# Patient Record
Sex: Female | Born: 1950 | Race: White | Hispanic: No | Marital: Married | Smoking: Never smoker
Health system: Southern US, Community
[De-identification: ages and names within clinical notes are randomized; demographics above are authoritative.]

## PROBLEM LIST (undated history)

## (undated) HISTORY — PX: APPENDECTOMY: SHX54

## (undated) HISTORY — PX: TONSILLECTOMY: SUR1361

---

## 2012-07-10 HISTORY — PX: COLONOSCOPY: SHX174

## 2019-04-09 ENCOUNTER — Other Ambulatory Visit: Payer: Self-pay | Admitting: Internal Medicine

## 2019-04-09 DIAGNOSIS — Z1231 Encounter for screening mammogram for malignant neoplasm of breast: Secondary | ICD-10-CM

## 2019-04-10 ENCOUNTER — Other Ambulatory Visit: Payer: Self-pay | Admitting: Internal Medicine

## 2019-04-10 DIAGNOSIS — Z8249 Family history of ischemic heart disease and other diseases of the circulatory system: Secondary | ICD-10-CM

## 2019-04-11 ENCOUNTER — Ambulatory Visit
Admission: RE | Admit: 2019-04-11 | Discharge: 2019-04-11 | Disposition: A | Discharge status: Home / Self Care | Payer: Medicare Other | Source: Ambulatory Visit | Attending: Internal Medicine | Admitting: Internal Medicine

## 2019-04-11 DIAGNOSIS — Z8249 Family history of ischemic heart disease and other diseases of the circulatory system: Secondary | ICD-10-CM

## 2019-04-16 ENCOUNTER — Other Ambulatory Visit: Payer: Self-pay

## 2019-04-16 ENCOUNTER — Ambulatory Visit
Admission: RE | Admit: 2019-04-16 | Discharge: 2019-04-16 | Disposition: A | Discharge status: Home / Self Care | Payer: Medicare Other | Source: Ambulatory Visit | Attending: Internal Medicine | Admitting: Internal Medicine

## 2019-04-16 DIAGNOSIS — Z1231 Encounter for screening mammogram for malignant neoplasm of breast: Secondary | ICD-10-CM

## 2019-05-07 ENCOUNTER — Other Ambulatory Visit: Payer: Self-pay | Admitting: Internal Medicine

## 2019-05-07 DIAGNOSIS — R928 Other abnormal and inconclusive findings on diagnostic imaging of breast: Secondary | ICD-10-CM

## 2019-05-22 ENCOUNTER — Other Ambulatory Visit: Payer: Self-pay

## 2019-05-23 ENCOUNTER — Ambulatory Visit: Payer: Self-pay | Admitting: Obstetrics & Gynecology

## 2019-12-17 ENCOUNTER — Ambulatory Visit (INDEPENDENT_AMBULATORY_CARE_PROVIDER_SITE_OTHER): Payer: Medicare Other | Admitting: Obstetrics and Gynecology

## 2019-12-17 ENCOUNTER — Encounter: Payer: Self-pay | Admitting: Obstetrics and Gynecology

## 2019-12-17 ENCOUNTER — Other Ambulatory Visit: Payer: Self-pay

## 2019-12-17 VITALS — BP 118/78 | Ht 66.5 in | Wt 149.0 lb

## 2019-12-17 DIAGNOSIS — Z01419 Encounter for gynecological examination (general) (routine) without abnormal findings: Secondary | ICD-10-CM | POA: Diagnosis not present

## 2019-12-17 DIAGNOSIS — Z780 Asymptomatic menopausal state: Secondary | ICD-10-CM

## 2019-12-17 NOTE — Progress Notes (Signed)
Tonya Vasquez Feb 18, 1951 [id number]  SUBJECTIVE:  69 y.o. G2P2 female new patient here for annual routine gynecologic exam.  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 She has no gynecologic concerns.   Current Outpatient Medications  Medication Sig Dispense Refill  . Multiple Vitamin (MULTIVITAMIN) tablet Take 1 tablet by mouth daily.    . Multiple Vitamins-Minerals (ZINC PO) Take by mouth.    Marland Kitchen VITAMIN E PO Take by mouth.     No current facility-administered medications for this visit.   Allergies: Patient has no known allergies.  No LMP recorded. Patient is postmenopausal.  Past medical history,surgical history, problem list, medications, allergies, family history and social history were all reviewed and documented as reviewed in the EPIC chart.  ROS:  Feeling well. No dyspnea or chest pain on exertion.  No abdominal pain, change in bowel habits, black or bloody stools.  No urinary tract symptoms. GYN ROS: no abnormal bleeding, pelvic pain or discharge, no breast pain or new or enlarging lumps on self exam. No neurological complaints.  OBJECTIVE:  BP 118/78   Ht 5' 6.5" (1.689 m)   Wt 149 lb (67.6 kg)   BMI 23.69 kg/m  The patient appears well, alert, oriented x 3, in no distress. ENT normal.  Neck supple. No cervical or supraclavicular adenopathy or thyromegaly.  Lungs are clear, good air entry, no wheezes, rhonchi or rales. S1 and S2 normal, no murmurs, regular rate and rhythm.  Abdomen soft without tenderness, guarding, mass or organomegaly.  Neurological is normal, no focal findings.  BREAST EXAM: breasts appear normal, no suspicious masses, no skin or nipple changes or axillary nodes  PELVIC EXAM: VULVA: normal appearing vulva with no masses, tenderness or lesions, atrophic changes, VAGINA: normal appearing vagina  with normal color and discharge, no lesions, CERVIX: normal appearing cervix without discharge or lesions, UTERUS: uterus is normal size, shape, consistency and nontender, ADNEXA: normal adnexa in size, nontender and no masses  Chaperone: Kennon Portela present during the examination  ASSESSMENT:  69 y.o. G2P2 here for annual gynecologic exam  PLAN:   1. Postmenopausal.  No hot flashes, no night sweats.  No vaginal bleeding. 2. Pap smear 2019.  No significant history of abnormal Pap smears.  Reports her Pap smear was normal last 2019 from [place], we do not have records available.  Next Pap smear due 2022 following the current guidelines recommending the 3 year interval, which would be next year if she should desire to screen.  We did discuss that per the current screening guidelines, if no recent abnormal Pap smears, after age 67 screening is not required.  We will readdress on annual basis. 3. Mammogram 04/2019.  Normal breast exam today.  She is reminded to schedule an annual mammogram this year when due. 4. Colonoscopy 2016.  Recommended that she follow up at the recommended interval.   5. DEXA 3 years ago or so per patient, she will check on dates.  She says that test was normal, which if it was, then the routine screening interval is every 5 years.  She is sending another request for records to her previous healthcare facility.   6. Health maintenance.  No labs today as she normally has these completed with her primary care provider.   Return annually or sooner, prn.  Theresia Majors MD 12/17/19

## 2020-05-12 ENCOUNTER — Other Ambulatory Visit: Payer: Self-pay | Admitting: Registered Nurse

## 2020-05-12 DIAGNOSIS — Z8249 Family history of ischemic heart disease and other diseases of the circulatory system: Secondary | ICD-10-CM

## 2020-05-18 ENCOUNTER — Other Ambulatory Visit: Payer: Self-pay | Admitting: Registered Nurse

## 2020-05-18 DIAGNOSIS — E2839 Other primary ovarian failure: Secondary | ICD-10-CM

## 2020-05-18 DIAGNOSIS — Z1231 Encounter for screening mammogram for malignant neoplasm of breast: Secondary | ICD-10-CM

## 2020-05-25 ENCOUNTER — Ambulatory Visit
Admission: RE | Admit: 2020-05-25 | Discharge: 2020-05-25 | Disposition: A | Discharge status: Home / Self Care | Payer: Medicare Other | Source: Ambulatory Visit | Attending: Registered Nurse | Admitting: Registered Nurse

## 2020-05-25 DIAGNOSIS — Z8249 Family history of ischemic heart disease and other diseases of the circulatory system: Secondary | ICD-10-CM

## 2020-06-18 ENCOUNTER — Other Ambulatory Visit: Payer: Self-pay

## 2020-06-18 ENCOUNTER — Ambulatory Visit
Admission: RE | Admit: 2020-06-18 | Discharge: 2020-06-18 | Disposition: A | Discharge status: Home / Self Care | Payer: Medicare Other | Source: Ambulatory Visit | Attending: Registered Nurse | Admitting: Registered Nurse

## 2020-06-18 DIAGNOSIS — Z1231 Encounter for screening mammogram for malignant neoplasm of breast: Secondary | ICD-10-CM

## 2020-06-28 ENCOUNTER — Ambulatory Visit: Payer: Medicare Other

## 2020-09-06 ENCOUNTER — Other Ambulatory Visit: Payer: Medicare Other

## 2020-09-29 IMAGING — MG DIGITAL SCREENING BILAT W/ TOMO W/ CAD
8 series · 8 of 24 positions shown · non-contrast
Comparison: None.
COMPARISON: None.

Addendum:
CLINICAL DATA: Screening.

EXAM:
DIGITAL SCREENING BILATERAL MAMMOGRAM WITH TOMO AND CAD

[L MLO synth-2D]
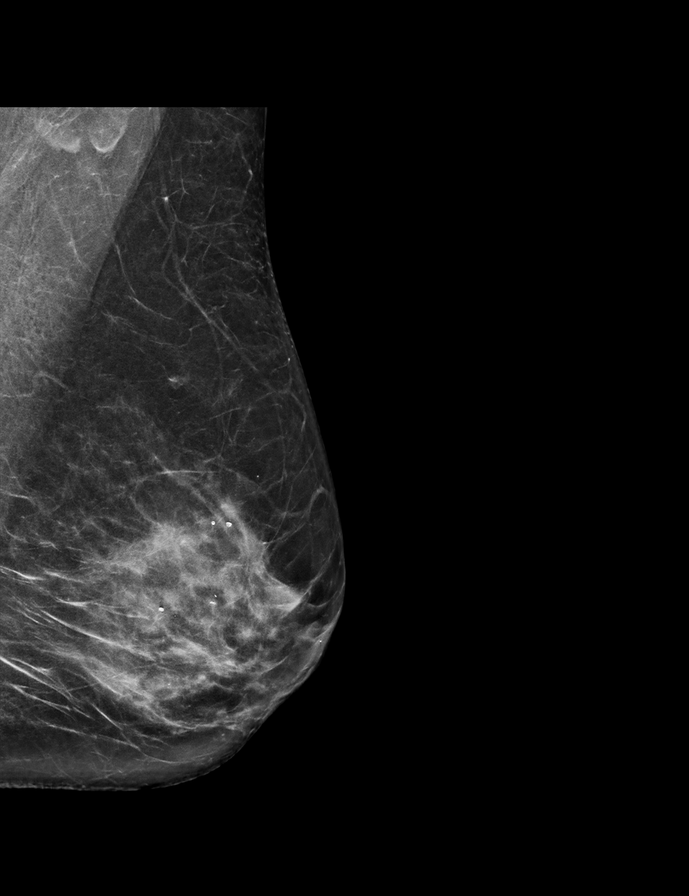

[L CC synth-2D]
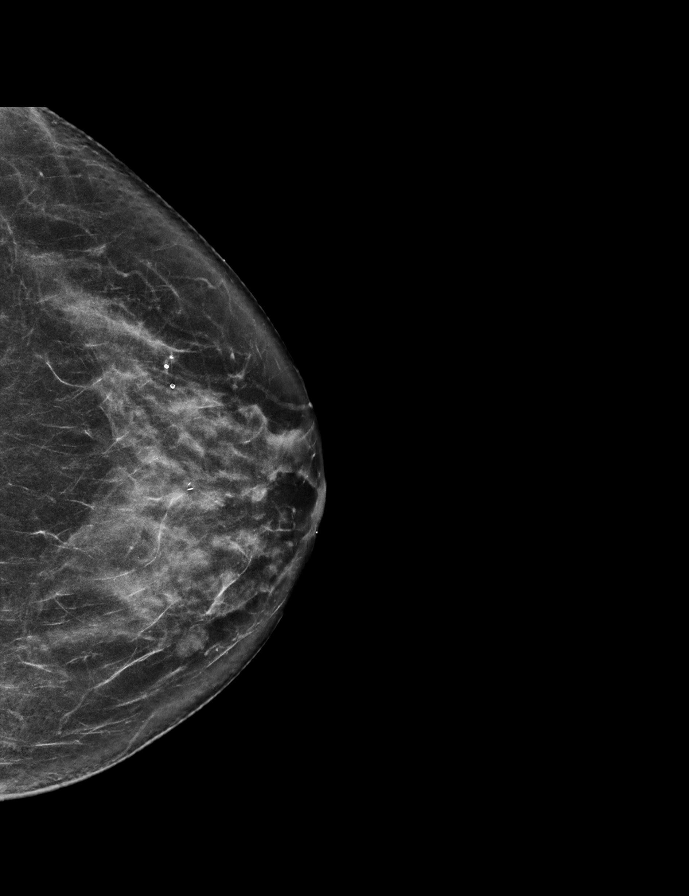

[R CC synth-2D]
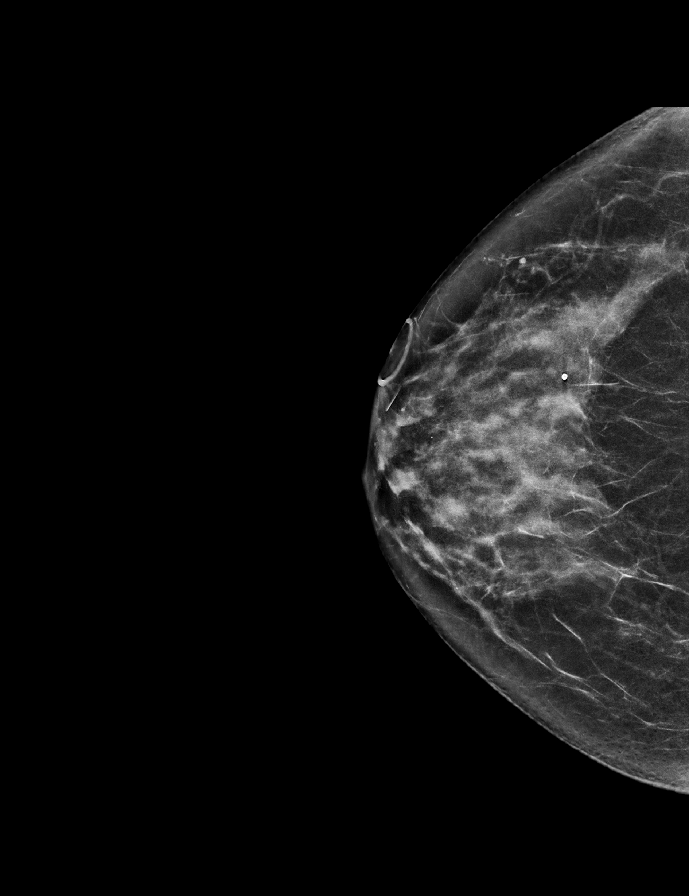

[R MLO synth-2D]
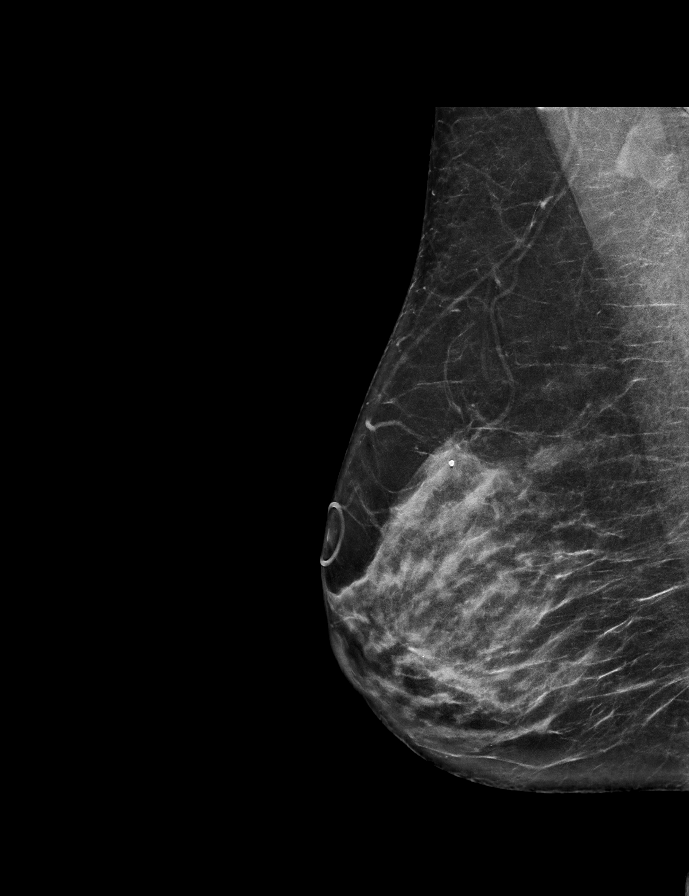

[R CC tomo · tomo slice 31/62.0]
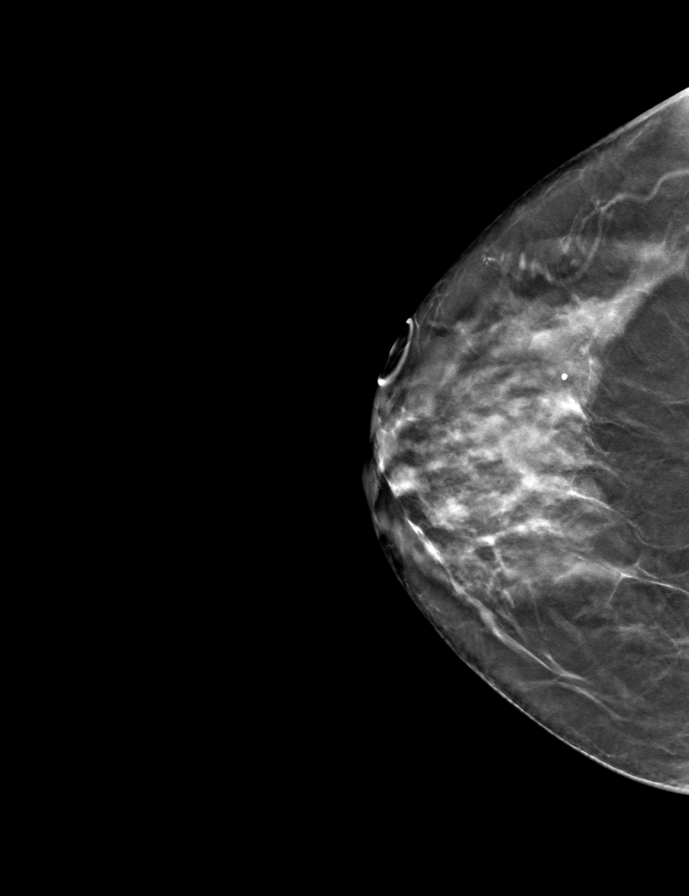

[L MLO tomo · tomo slice 35/68.0]
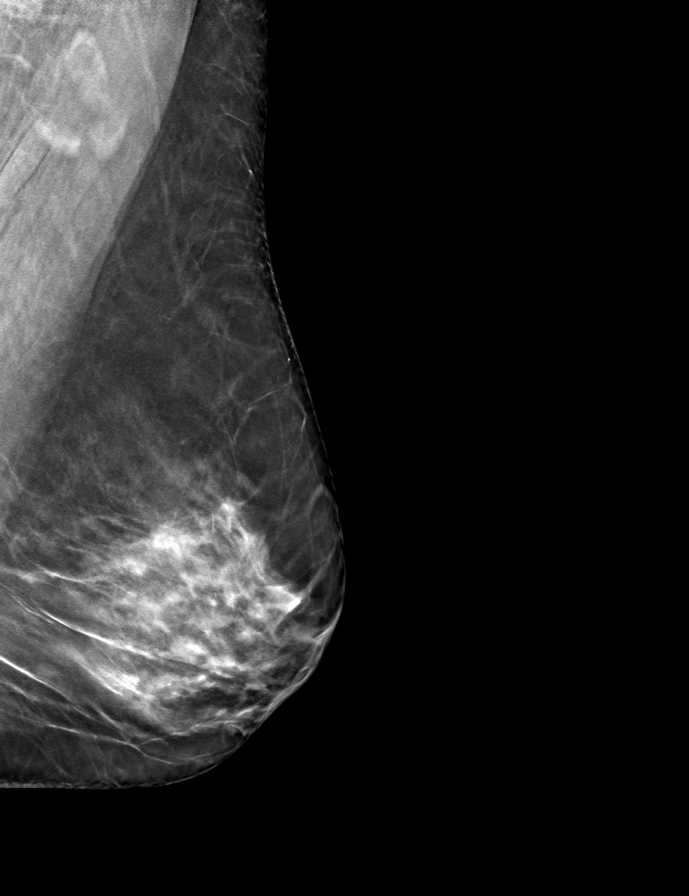

[R MLO tomo · tomo slice 33/65.0]
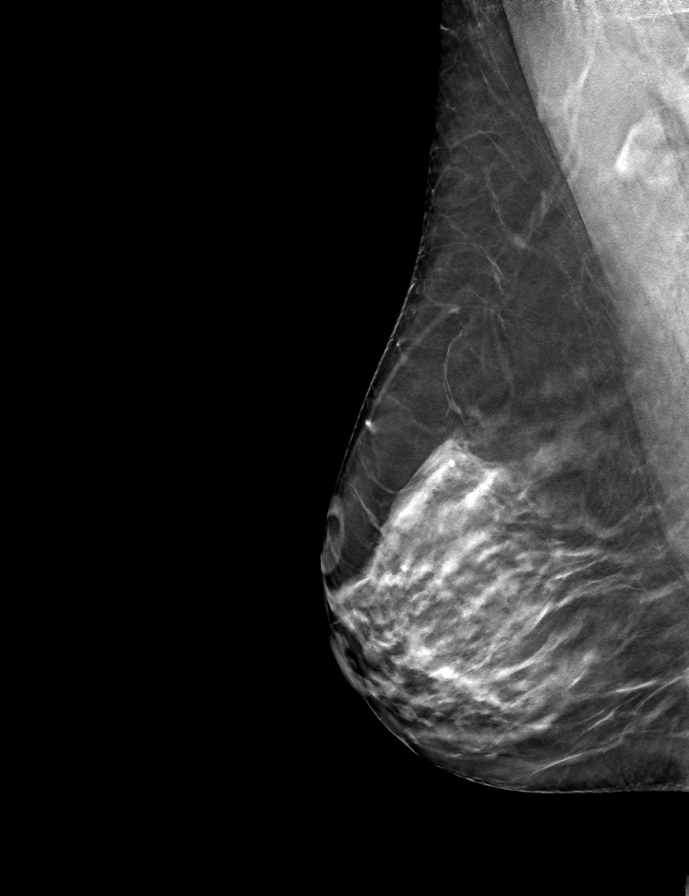

[L CC tomo · tomo slice 33/64.0]
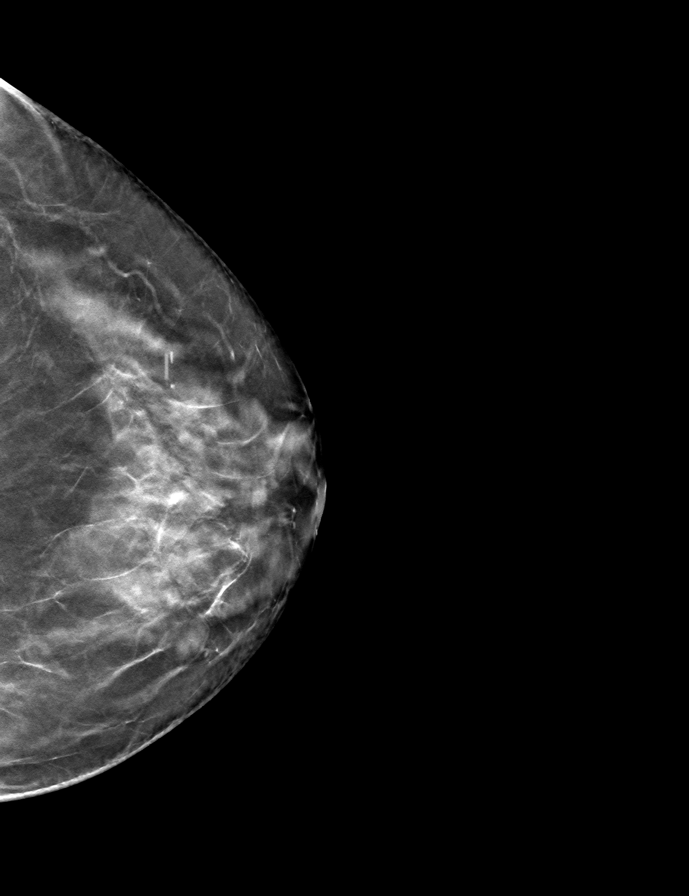

[8 of 24 positions shown; findings below may reference images not displayed]

Multiple attempts to obtain previous examinations
were unsuccessful.

ACR Breast Density Category c: The breast tissue is heterogeneously
dense, which may obscure small masses.
FINDINGS: In the left breast, a possible mass warrants further evaluation. In
the right breast, no findings suspicious for malignancy.

Images were processed with CAD.
IMPRESSION: Further evaluation is suggested for possible mass in the left
breast.

RECOMMENDATION:
Diagnostic mammogram and possibly ultrasound of the left breast.
(Code:F0-A-RRY)

The patient will be contacted regarding the findings, and additional
imaging will be scheduled.

BI-RADS CATEGORY  0: Incomplete. Need additional imaging evaluation
and/or prior mammograms for comparison.

ADDENDUM:
Previous examinations from [REDACTED], [REDACTED], dating back to
01/31/2016, are now available for comparison. The currently
suspected left breast mass has been present on the previous
examinations dating back to 6398 and is unchanged. Therefore, this
is benign and does not require further follow-up.
IMPRESSION: Mammographically stable benign left breast mass.

RECOMMENDATION:

Bilateral screening mammogram in 1 year.

BI-RADS CATEGORY:

2: Benign.

*** End of Addendum ***
Multiple attempts to obtain previous examinations
were unsuccessful.

ACR Breast Density Category c: The breast tissue is heterogeneously
dense, which may obscure small masses.
FINDINGS: In the left breast, a possible mass warrants further evaluation. In
the right breast, no findings suspicious for malignancy.

Images were processed with CAD.
IMPRESSION: Further evaluation is suggested for possible mass in the left
breast.

RECOMMENDATION:
Diagnostic mammogram and possibly ultrasound of the left breast.
(Code:F0-A-RRY)

The patient will be contacted regarding the findings, and additional
imaging will be scheduled.

BI-RADS CATEGORY  0: Incomplete. Need additional imaging evaluation
and/or prior mammograms for comparison.

## 2021-02-23 ENCOUNTER — Other Ambulatory Visit: Payer: Medicare Other

## 2021-02-23 ENCOUNTER — Ambulatory Visit
Admission: RE | Admit: 2021-02-23 | Discharge: 2021-02-23 | Disposition: A | Discharge status: Home / Self Care | Payer: Medicare Other | Source: Ambulatory Visit | Attending: Registered Nurse | Admitting: Registered Nurse

## 2021-02-23 ENCOUNTER — Other Ambulatory Visit: Payer: Self-pay

## 2021-02-23 DIAGNOSIS — E2839 Other primary ovarian failure: Secondary | ICD-10-CM

## 2021-04-06 ENCOUNTER — Ambulatory Visit: Payer: Medicare Other | Admitting: Nurse Practitioner

## 2021-04-25 ENCOUNTER — Other Ambulatory Visit: Payer: Self-pay

## 2021-04-25 ENCOUNTER — Ambulatory Visit (INDEPENDENT_AMBULATORY_CARE_PROVIDER_SITE_OTHER): Payer: Medicare Other | Admitting: Nurse Practitioner

## 2021-04-25 ENCOUNTER — Encounter: Payer: Self-pay | Admitting: Nurse Practitioner

## 2021-04-25 ENCOUNTER — Other Ambulatory Visit (HOSPITAL_COMMUNITY)
Admission: RE | Admit: 2021-04-25 | Discharge: 2021-04-25 | Disposition: A | Discharge status: Home / Self Care | Payer: Medicare Other | Source: Ambulatory Visit | Attending: Nurse Practitioner | Admitting: Nurse Practitioner

## 2021-04-25 VITALS — BP 124/80 | Ht 66.0 in | Wt 152.0 lb

## 2021-04-25 DIAGNOSIS — Z780 Asymptomatic menopausal state: Secondary | ICD-10-CM

## 2021-04-25 DIAGNOSIS — Z01419 Encounter for gynecological examination (general) (routine) without abnormal findings: Secondary | ICD-10-CM | POA: Insufficient documentation

## 2021-04-25 DIAGNOSIS — M8589 Other specified disorders of bone density and structure, multiple sites: Secondary | ICD-10-CM

## 2021-04-25 NOTE — Progress Notes (Signed)
   Tonya Vasquez 1951-05-12 [id number]   History:  70 y.o. G2P2 presents for breast and pelvic exam. No GYN complaints. Postmenopausal - no HRT, no bleeding. Reports normal pap history. Moved from [place] a few years ago. Neurology managing meningioma (found 2013).   Gynecologic History No LMP recorded. Patient is postmenopausal.   Contraception: post menopausal status Sexually active: Yes  Health Maintenance Last Pap: 2019. Results were: Normal Last mammogram: 06/18/2020. Results were: Normal Last colonoscopy: 2016 per patient. Results were: Normal, 10-year recall Last Dexa: 02/23/2021. Results were: T-score -2.2, FRAX 8.4% / 0.8%  Past medical history, past surgical history, family history and social history were all reviewed and documented in the EPIC chart. Married. Daughter lives here, son in [place]. Alternates time between here and [place].   ROS:  A ROS was performed and pertinent positives and negatives are included.  Exam:  Vitals:   04/25/21 1053  BP: 124/80  Weight: 152 lb (68.9 kg)  Height: 5\' 6"  (1.676 m)   Body mass index is 24.53 kg/m.  General appearance:  Normal Thyroid:  Symmetrical, normal in size, without palpable masses or nodularity. Respiratory  Auscultation:  Clear without wheezing or rhonchi Cardiovascular  Auscultation:  Regular rate, without rubs, murmurs or gallops  Edema/varicosities:  Not grossly evident Abdominal  Soft,nontender, without masses, guarding or rebound.  Liver/spleen:  No organomegaly noted  Hernia:  None appreciated  Skin  Inspection:  Grossly normal Breasts: Examined lying and sitting.   Right: Without masses, retractions, nipple discharge or axillary adenopathy.   Left: Without masses, retractions, nipple discharge or axillary adenopathy. Genitourinary   Inguinal/mons:  Normal without inguinal adenopathy  External genitalia:  Normal appearing vulva with no masses, tenderness, or lesions  BUS/Urethra/Skene's glands:   Normal  Vagina:  Atrophic changes  Cervix:  Normal appearing without discharge or lesions  Uterus:  Normal in size, shape and contour.  Midline and mobile, nontender  Adnexa/parametria:     Rt: Normal in size, without masses or tenderness.   Lt: Normal in size, without masses or tenderness.  Anus and perineum: Normal  Digital rectal exam: Normal sphincter tone without palpated masses or tenderness  Patient informed chaperone available to be present for breast and pelvic exam. Patient has requested no chaperone to be present. Patient has been advised what will be completed during breast and pelvic exam.   Assessment/Plan:  70 y.o. G2P2 for breast and pelvic exam.   Well female exam with routine gynecological exam - Plan: Cytology - PAP( ). Education provided on SBEs, importance of preventative screenings, current guidelines, high calcium diet, regular exercise, and multivitamin daily.  Labs with PCP.   Postmenopausal - no HRT, no bleeding.   Osteopenia of multiple sites - Continue Vitamin D + Calcium and regular exercise.   Screening for cervical cancer - Normal Pap history. Discussed current guidelines and option to stop screenings. We agree to do pap today and if normal stop screenings.   Screening for breast cancer - Normal mammogram history.  Continue annual screenings.  Normal breast exam today.  Screening for colon cancer - 2016 colonoscopy. Will repeat at GI's recommended interval.   Return in 2 years for breast and pelvic exam.   2017 DNP, 11:14 AM 04/25/2021

## 2021-04-26 LAB — CYTOLOGY - PAP: Diagnosis: NEGATIVE

## 2021-08-16 ENCOUNTER — Other Ambulatory Visit: Payer: Self-pay | Admitting: Registered Nurse

## 2021-08-16 DIAGNOSIS — Z1231 Encounter for screening mammogram for malignant neoplasm of breast: Secondary | ICD-10-CM

## 2021-08-29 ENCOUNTER — Ambulatory Visit
Admission: RE | Admit: 2021-08-29 | Discharge: 2021-08-29 | Disposition: A | Discharge status: Home / Self Care | Payer: Medicare HMO | Source: Ambulatory Visit | Attending: Registered Nurse | Admitting: Registered Nurse

## 2021-08-29 DIAGNOSIS — Z1231 Encounter for screening mammogram for malignant neoplasm of breast: Secondary | ICD-10-CM

## 2022-05-17 ENCOUNTER — Other Ambulatory Visit: Payer: Self-pay | Admitting: Registered Nurse

## 2022-05-17 DIAGNOSIS — I700 Atherosclerosis of aorta: Secondary | ICD-10-CM

## 2022-05-24 ENCOUNTER — Ambulatory Visit
Admission: RE | Admit: 2022-05-24 | Discharge: 2022-05-24 | Disposition: A | Discharge status: Home / Self Care | Payer: Medicare HMO | Source: Ambulatory Visit | Attending: Registered Nurse | Admitting: Registered Nurse

## 2022-05-24 DIAGNOSIS — I700 Atherosclerosis of aorta: Secondary | ICD-10-CM

## 2022-09-15 ENCOUNTER — Other Ambulatory Visit: Payer: Self-pay | Admitting: Registered Nurse

## 2022-09-15 DIAGNOSIS — Z1231 Encounter for screening mammogram for malignant neoplasm of breast: Secondary | ICD-10-CM

## 2022-09-19 ENCOUNTER — Ambulatory Visit
Admission: RE | Admit: 2022-09-19 | Discharge: 2022-09-19 | Disposition: A | Discharge status: Home / Self Care | Payer: Medicare HMO | Source: Ambulatory Visit | Attending: Registered Nurse | Admitting: Registered Nurse

## 2022-09-19 DIAGNOSIS — Z1231 Encounter for screening mammogram for malignant neoplasm of breast: Secondary | ICD-10-CM

## 2023-05-09 ENCOUNTER — Telehealth: Payer: Self-pay | Admitting: Gastroenterology

## 2023-05-09 ENCOUNTER — Ambulatory Visit (AMBULATORY_SURGERY_CENTER): Payer: Medicare HMO | Admitting: *Deleted

## 2023-05-09 VITALS — Ht 66.0 in | Wt 147.0 lb

## 2023-05-09 DIAGNOSIS — Z1211 Encounter for screening for malignant neoplasm of colon: Secondary | ICD-10-CM

## 2023-05-09 MED ORDER — CLENPIQ 10-3.5-12 MG-GM -GM/160ML PO SOLN: 320.0000 mL | Freq: Once | ORAL | 0 refills | Status: AC

## 2023-05-09 MED ORDER — SUTAB 1479-225-188 MG PO TABS: 24.0000 | ORAL_TABLET | Freq: Once | ORAL | 0 refills | Status: DC

## 2023-05-09 NOTE — Addendum Note (Signed)
Addended by: Caren Griffins F on: 05/09/2023 12:13 PM   Modules accepted: Orders, Level of Service

## 2023-05-09 NOTE — Progress Notes (Addendum)
Pre visit completed over telephone. Instructions forwarded through MyChart and mailed, along with [facility] Sutab coupon. Patient requested tabs, Advised pt that Sutab was NOT our preferred prep but patient maintained that is what she thought she could manage.  PATIENT CALLED BACK ASKING TO SWITCH TO CLENPIQ- CANCELLED SUTAB AND SENT RX OF CLENPIQ, PHARMACY NOTIFIED OF THE SWITCH  Instructions retracted and re-sent new instructions for CLENPIQ, also mailed to home address   No egg or soy allergy known to patient  No issues known to pt with past sedation with any surgeries or procedures Patient denies ever being told they had issues or difficulty with intubation  No FH of Malignant Hyperthermia Pt is not on diet pills Pt is not on  home 02  Pt is not on blood thinners  Pt denies issues with constipation  No A fib or A flutter Have any cardiac testing pending--no Pt instructed to use [email or web address] or GoodRx for a price reduction on prep

## 2023-05-09 NOTE — Telephone Encounter (Signed)
Inbound call from patient, states during her pre-visit she advised the nurse she was no longer taking Vitamin D and Atorvastatin. Patient would like medications taken off the list.

## 2023-05-09 NOTE — Addendum Note (Signed)
Addended by: Caren Griffins F on: 05/09/2023 10:37 AM   Modules accepted: Orders

## 2023-05-09 NOTE — Addendum Note (Signed)
Addended by: Caren Griffins F on: 05/09/2023 11:41 AM   Modules accepted: Orders

## 2023-05-21 ENCOUNTER — Encounter: Payer: Self-pay | Admitting: Gastroenterology

## 2023-05-23 ENCOUNTER — Encounter: Payer: Self-pay | Admitting: Gastroenterology

## 2023-05-31 ENCOUNTER — Encounter: Payer: Self-pay | Admitting: Gastroenterology

## 2023-05-31 ENCOUNTER — Ambulatory Visit: Payer: Medicare HMO | Admitting: Gastroenterology

## 2023-05-31 VITALS — BP 116/68 | HR 66 | Temp 97.0°F | Resp 17 | Ht 66.0 in | Wt 147.0 lb

## 2023-05-31 DIAGNOSIS — K640 First degree hemorrhoids: Secondary | ICD-10-CM | POA: Diagnosis not present

## 2023-05-31 DIAGNOSIS — Z860100 Personal history of colon polyps, unspecified: Secondary | ICD-10-CM | POA: Diagnosis not present

## 2023-05-31 DIAGNOSIS — D124 Benign neoplasm of descending colon: Secondary | ICD-10-CM

## 2023-05-31 DIAGNOSIS — K5730 Diverticulosis of large intestine without perforation or abscess without bleeding: Secondary | ICD-10-CM

## 2023-05-31 DIAGNOSIS — Z1211 Encounter for screening for malignant neoplasm of colon: Secondary | ICD-10-CM | POA: Diagnosis present

## 2023-05-31 MED ORDER — SODIUM CHLORIDE 0.9 % IV SOLN: 500.0000 mL | INTRAVENOUS | Status: DC

## 2023-05-31 NOTE — Op Note (Signed)
[facility] Patient Name: Tonya Vasquez Procedure Date: 05/31/2023 2:55 PM MRN: [id number] Endoscopist: Lynann Bologna , MD, [id number] Age: 72 Referring MD:  Date of Birth: 16-Jul-1950 Gender: Female Account #: [id number] Procedure:                Colonoscopy Indications:              Screening for colorectal malignant neoplasm Medicines:                Monitored Anesthesia Care Procedure:                Pre-Anesthesia Assessment:                           - Prior to the procedure, a History and Physical                            was performed, and patient medications and                            allergies were reviewed. The patient's tolerance of                            previous anesthesia was also reviewed. The risks                            and benefits of the procedure and the sedation                            options and risks were discussed with the patient.                            All questions were answered, and informed consent                            was obtained. Prior Anticoagulants: The patient has                            taken no anticoagulant or antiplatelet agents. ASA                            Grade Assessment: I. After reviewing the risks and                            benefits, the patient was deemed in satisfactory                            condition to undergo the procedure.                           After obtaining informed consent, the colonoscope                            was passed under direct vision. Throughout the  procedure, the patient's blood pressure, pulse, and                            oxygen saturations were monitored continuously. The                            Olympus Scope [id number] was introduced through the                            anus and advanced to the the cecum, identified by                            appendiceal orifice and ileocecal valve. The                             colonoscopy was performed without difficulty. The                            patient tolerated the procedure well. The quality                            of the bowel preparation was good. The ileocecal                            valve, appendiceal orifice, and rectum were                            photographed. Scope In: 3:07:12 PM Scope Out: 3:21:10 PM Scope Withdrawal Time: 0 hours 9 minutes 58 seconds  Total Procedure Duration: 0 hours 13 minutes 58 seconds  Findings:                 A 6 mm polyp was found in the mid descending colon.                            The polyp was sessile. The polyp was removed with a                            cold snare. Resection and retrieval were complete.                           Multiple medium-mouthed diverticula were found in                            the sigmoid colon and descending colon.                           Non-bleeding internal hemorrhoids were found during                            retroflexion. The hemorrhoids were small and Grade                            I (internal hemorrhoids that do not prolapse).  The exam was otherwise without abnormality on                            direct and retroflexion views. Complications:            No immediate complications. Estimated Blood Loss:     Estimated blood loss: none. Impression:               - One 6 mm polyp in the mid descending colon,                            removed with a cold snare. Resected and retrieved.                           - Moderate left colonic diverticulosis.                           - Non-bleeding internal hemorrhoids.                           - The examination was otherwise normal on direct                            and retroflexion views. Recommendation:           - Patient has a contact number available for                            emergencies. The signs and symptoms of potential                            delayed complications were  discussed with the                            patient. Return to normal activities tomorrow.                            Written discharge instructions were provided to the                            patient.                           - High fiber diet.                           - Continue present medications.                           - Await pathology results.                           - Repeat colonoscopy for surveillance based on                            pathology results.                           -  The findings and recommendations were discussed                            with the patient's family. Lynann Bologna, MD 05/31/2023 3:26:48 PM This report has been signed electronically.

## 2023-05-31 NOTE — Progress Notes (Signed)
Pt's states no medical or surgical changes since previsit or office visit. 

## 2023-05-31 NOTE — Patient Instructions (Addendum)
Recommendation:           - Patient has a contact number available for                            emergencies. The signs and symptoms of potential                            delayed complications were discussed with the                            patient. Return to normal activities tomorrow.                            Written discharge instructions were provided to the                            patient.                           - High fiber diet.                           - Continue present medications.                           - Await pathology results.                           - Repeat colonoscopy for surveillance based on                            pathology results.                           - The findings and recommendations were discussed                            with the patient's family.  Handouts given on polyps, diverticulosis, hemorrhoids and high fiber diet.  YOU HAD AN ENDOSCOPIC PROCEDURE TODAY AT THE [facility]:   Refer to the procedure report that was given to you for any specific questions about what was found during the examination.  If the procedure report does not answer your questions, please call your gastroenterologist to clarify.  If you requested that your care partner not be given the details of your procedure findings, then the procedure report has been included in a sealed envelope for you to review at your convenience later.  YOU SHOULD EXPECT: Some feelings of bloating in the abdomen. Passage of more gas than usual.  Walking can help get rid of the air that was put into your GI tract during the procedure and reduce the bloating. If you had a lower endoscopy (such as a colonoscopy or flexible sigmoidoscopy) you may notice spotting of blood in your stool or on the toilet paper. If you underwent a bowel prep for your procedure, you may not have a normal bowel movement for a few days.  Please Note:  You might notice some irritation and congestion in  your nose or some  drainage.  This is from the oxygen used during your procedure.  There is no need for concern and it should clear up in a day or so.  SYMPTOMS TO REPORT IMMEDIATELY:  Following lower endoscopy (colonoscopy or flexible sigmoidoscopy):  Excessive amounts of blood in the stool  Significant tenderness or worsening of abdominal pains  Swelling of the abdomen that is new, acute  Fever of 100F or higher  For urgent or emergent issues, a gastroenterologist can be reached at any hour by calling [phone]. Do not use MyChart messaging for urgent concerns.    DIET:  We do recommend a small meal at first, but then you may proceed to your regular diet.  Drink plenty of fluids but you should avoid alcoholic beverages for 24 hours.  ACTIVITY:  You should plan to take it easy for the rest of today and you should NOT DRIVE or use heavy machinery until tomorrow (because of the sedation medicines used during the test).    FOLLOW UP: Our staff will call the number listed on your records the next business day following your procedure.  We will call around 7:15- 8:00 am to check on you and address any questions or concerns that you may have regarding the information given to you following your procedure. If we do not reach you, we will leave a message.     If any biopsies were taken you will be contacted by phone or by letter within the next 1-3 weeks.  Please call us at [phone] if you have not heard about the biopsies in 3 weeks.    SIGNATURES/CONFIDENTIALITY: You and/or your care partner have signed paperwork which will be entered into your electronic medical record.  These signatures attest to the fact that that the information above on your After Visit Summary has been reviewed and is understood.  Full responsibility of the confidentiality of this discharge information lies with you and/or your care-partner.

## 2023-05-31 NOTE — Progress Notes (Signed)
To pacu, VSS. Report to [email or web address] 

## 2023-05-31 NOTE — Progress Notes (Signed)
Gastroenterology History and Physical   Primary Care Physician:  Virl Cagey, MD   Reason for Procedure:   CRC screening- last colonoscopy over 10 years ago  Plan:    colon     HPI: Tonya Vasquez is a 72 y.o. female    History reviewed. No pertinent past medical history.  Past Surgical History:  Procedure Laterality Date   APPENDECTOMY     [place]ONOSCOPY  2014   [place]-normal per pt   TONSILLECTOMY      Prior to Admission medications   Medication Sig Start Date End Date Taking? Authorizing Provider  Ascorbic Acid (VITAMIN C PO) Take by mouth.   Yes [provider]  Calcium Carbonate-Vit D-Min (CALCIUM 1200 PO) Take by mouth.   Yes [provider]  Coenzyme Q10 (COQ10 PO) Take by mouth.   Yes [provider]  Multiple Vitamin (MULTIVITAMIN) tablet Take 1 tablet by mouth daily.   Yes [provider]  Omega-3 Fatty Acids (FISH OIL PO) Take by mouth.   Yes [provider]  VITAMIN E PO Take by mouth.   Yes [provider]    Current Outpatient Medications  Medication Sig Dispense Refill   Ascorbic Acid (VITAMIN C PO) Take by mouth.     Calcium Carbonate-Vit D-Min (CALCIUM 1200 PO) Take by mouth.     Coenzyme Q10 (COQ10 PO) Take by mouth.     Multiple Vitamin (MULTIVITAMIN) tablet Take 1 tablet by mouth daily.     Omega-3 Fatty Acids (FISH OIL PO) Take by mouth.     VITAMIN E PO Take by mouth.     Current Facility-Administered Medications  Medication Dose Route Frequency Provider Last Rate Last Admin   0.9 %  sodium chloride infusion  500 mL Intravenous Continuous Lynann Bologna, MD        Allergies as of 05/31/2023   (No Known Allergies)    Family History  Problem Relation Age of Onset   Alzheimer's disease Mother    Aneurysm Father    Heart attack Father    Aneurysm Sister    Cancer - Colon Neg Hx     Social History   Socioeconomic History   Marital status: Married    Spouse name: Not on  file   Number of children: Not on file   Years of education: Not on file   Highest education level: Not on file  Occupational History   Not on file  Tobacco Use   Smoking status: Never   Smokeless tobacco: Never  Vaping Use   Vaping status: Never Used  Substance and Sexual Activity   Alcohol use: Yes    Comment: Rare   Drug use: Never   Sexual activity: Not Currently    Birth control/protection: Post-menopausal    Comment: 1st intercourse 72 yo-Fewer than 5 partners  Other Topics Concern   Not on file  Social History Narrative   Not on file   Social Determinants of Health   Financial Resource Strain: Not on file  Food Insecurity: Not on file  Transportation Needs: Not on file  Physical Activity: Not on file  Stress: Not on file  Social Connections: Not on file  Intimate Partner Violence: Not on file    Review of Systems: Positive for none All other review of systems negative except as mentioned in the HPI.  Physical Exam: Vital signs in last 24 hours: @VSRANGES @   General:   Alert,  Well-developed, well-nourished, pleasant and cooperative in  NAD Lungs:  Clear throughout to auscultation.   Heart:  Regular rate and rhythm; no murmurs, clicks, rubs,  or gallops. Abdomen:  Soft, nontender and nondistended. Normal bowel sounds.   Neuro/Psych:  Alert and cooperative. Normal mood and affect. A and O x 3    No significant changes were identified.  The patient continues to be an appropriate candidate for the planned procedure and anesthesia.   Edman Circle, MD. [facility] Gastroenterology 05/31/2023 2:56 PM@

## 2023-05-31 NOTE — Progress Notes (Signed)
Called to room to assist during endoscopic procedure.  Patient ID and intended procedure confirmed with present staff. Received instructions for my participation in the procedure from the performing physician.  

## 2023-06-01 ENCOUNTER — Telehealth: Payer: Self-pay

## 2023-06-01 NOTE — Telephone Encounter (Signed)
Follow up call to pt, lm for pt to call if having any difficulty with normal activities or eating and drinking.  Also to call if any other questions or concerns.  

## 2023-06-05 LAB — SURGICAL PATHOLOGY

## 2023-06-11 ENCOUNTER — Encounter: Payer: Self-pay | Admitting: Gastroenterology

## 2023-09-04 ENCOUNTER — Other Ambulatory Visit: Payer: Self-pay | Admitting: Registered Nurse

## 2023-09-04 DIAGNOSIS — Z1231 Encounter for screening mammogram for malignant neoplasm of breast: Secondary | ICD-10-CM

## 2023-09-05 ENCOUNTER — Other Ambulatory Visit: Payer: Self-pay | Admitting: Medical Genetics

## 2023-09-14 ENCOUNTER — Other Ambulatory Visit: Payer: Self-pay

## 2023-09-14 DIAGNOSIS — Z006 Encounter for examination for normal comparison and control in clinical research program: Secondary | ICD-10-CM

## 2023-09-26 ENCOUNTER — Ambulatory Visit
Admission: RE | Admit: 2023-09-26 | Discharge: 2023-09-26 | Disposition: A | Discharge status: Home / Self Care | Payer: Medicare HMO | Source: Ambulatory Visit | Attending: Registered Nurse | Admitting: Registered Nurse

## 2023-09-26 DIAGNOSIS — Z1231 Encounter for screening mammogram for malignant neoplasm of breast: Secondary | ICD-10-CM

## 2023-09-26 LAB — GENECONNECT MOLECULAR SCREEN: Genetic Analysis Overall Interpretation: NEGATIVE
